# Patient Record
Sex: Male | Born: 1970 | Race: White | Hispanic: No | Marital: Married | Smoking: Current every day smoker
Health system: Southern US, Community
[De-identification: ages and names within clinical notes are randomized; demographics above are authoritative.]

## PROBLEM LIST (undated history)

## (undated) DIAGNOSIS — Z720 Tobacco use: Secondary | ICD-10-CM

## (undated) DIAGNOSIS — I10 Essential (primary) hypertension: Secondary | ICD-10-CM

## (undated) HISTORY — DX: Tobacco use: Z72.0

## (undated) HISTORY — DX: Essential (primary) hypertension: I10

---

## 2009-12-10 ENCOUNTER — Encounter: Admission: RE | Admit: 2009-12-10 | Discharge: 2009-12-10 | Payer: Self-pay | Admitting: Gastroenterology

## 2010-12-20 IMAGING — CT CT ABD-PELV W/ CM
2 of 4 series · 11 of 36 positions shown, 18 images · IV contrast (READICAT/WATER & [ID] OMNI 300)
Comparison: None.

CLINICAL DATA: Left lower quadrant pain.

CT ABDOMEN AND PELVIS WITH CONTRAST
TECHNIQUE: Multidetector CT imaging of the abdomen and pelvis was
performed following the standard protocol during bolus
administration of intravenous contrast.
Contrast: 100 ml [id number]

[Series 3: routine abdomen · axial · 0.66mm/px · z∈[-361,-1]mm · 10 of 89 slices shown, 16 images]
[im 9/89  soft-tissue]
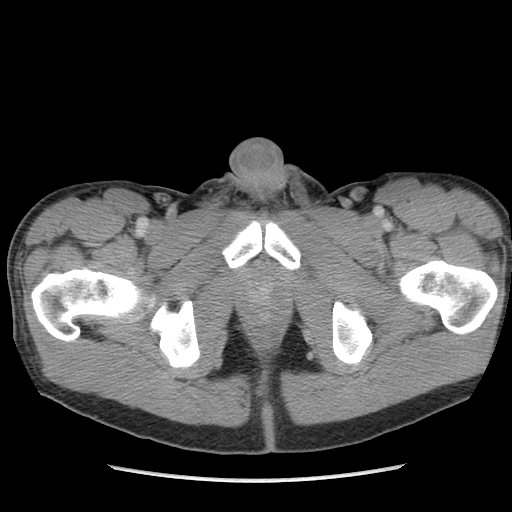
[im 9/89  bone]
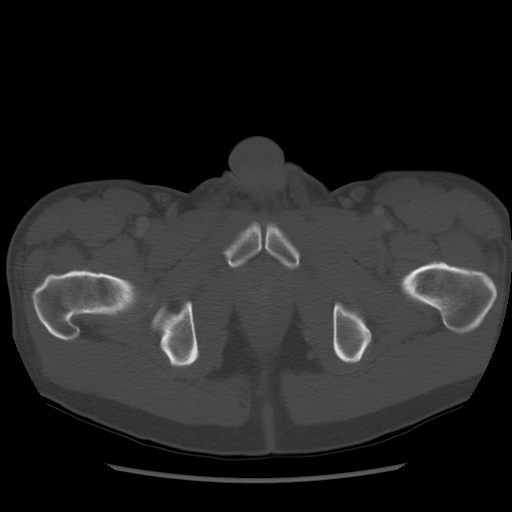
[im 17/89  soft-tissue]
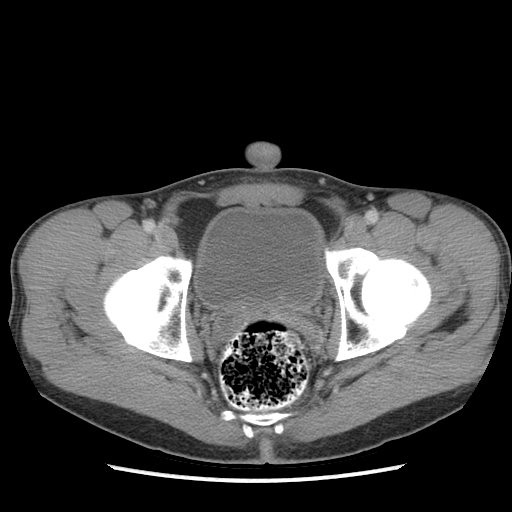
[im 25/89  soft-tissue]
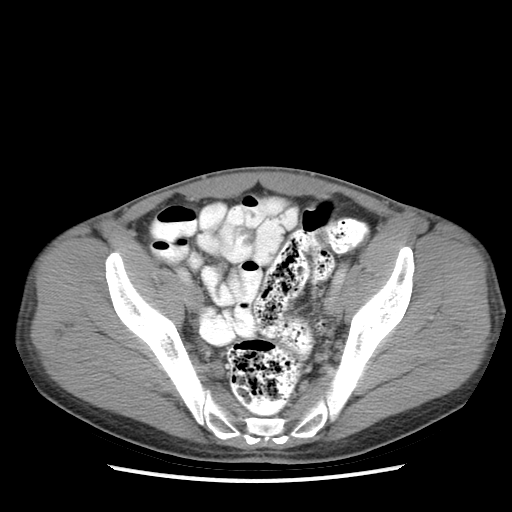
[im 33/89  soft-tissue]
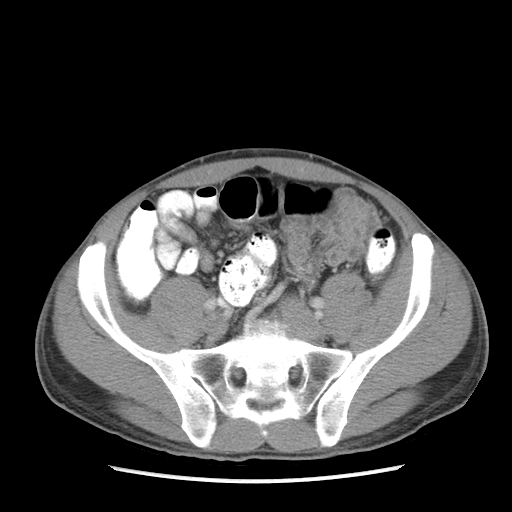
[im 41/89  soft-tissue]
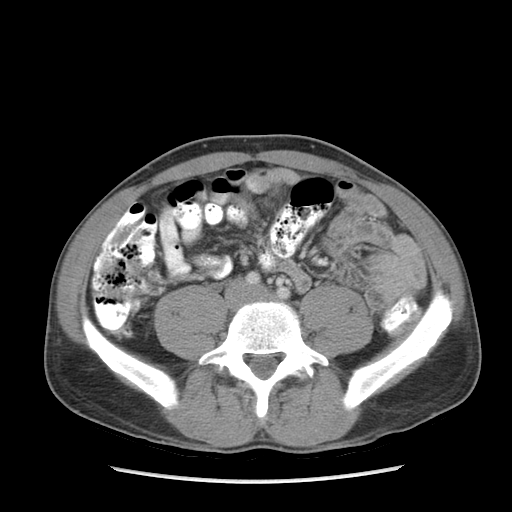
[im 49/89  soft-tissue]
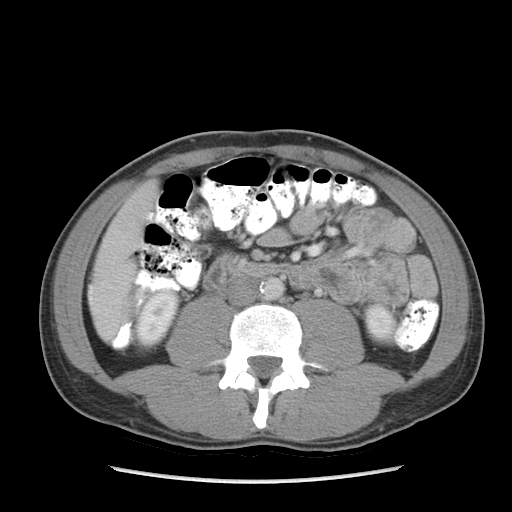
[im 57/89  soft-tissue]
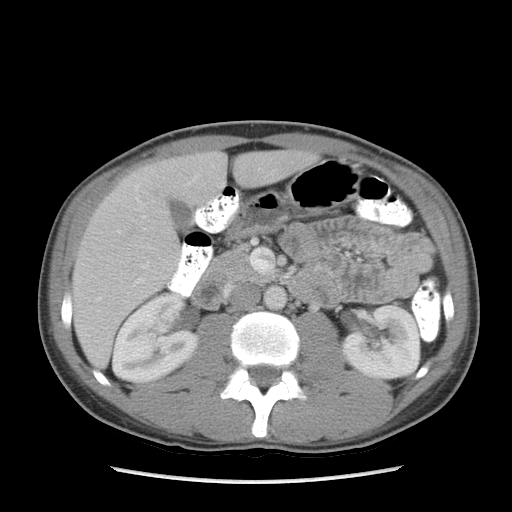
[im 57/89  lung]
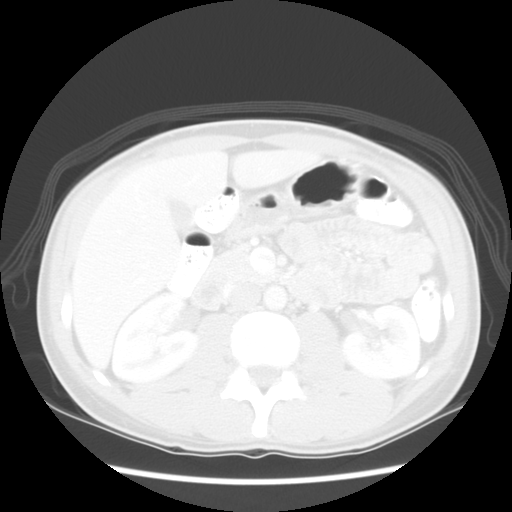
[im 65/89  soft-tissue]
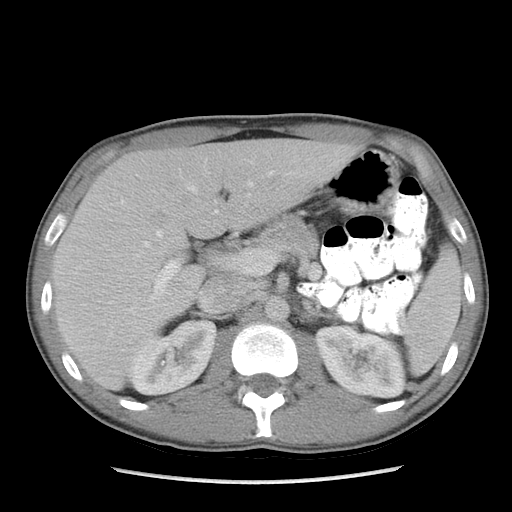
[im 65/89  lung]
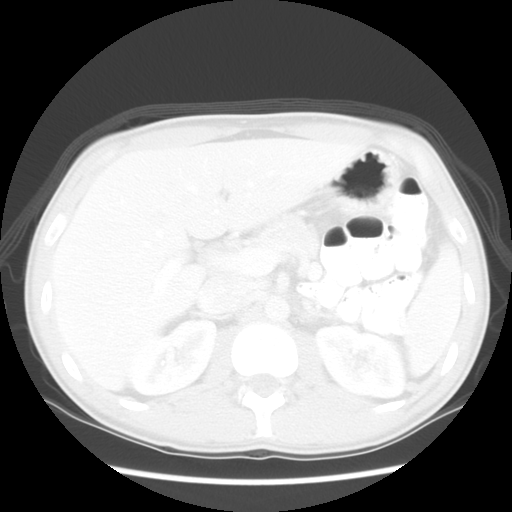
[im 73/89  soft-tissue]
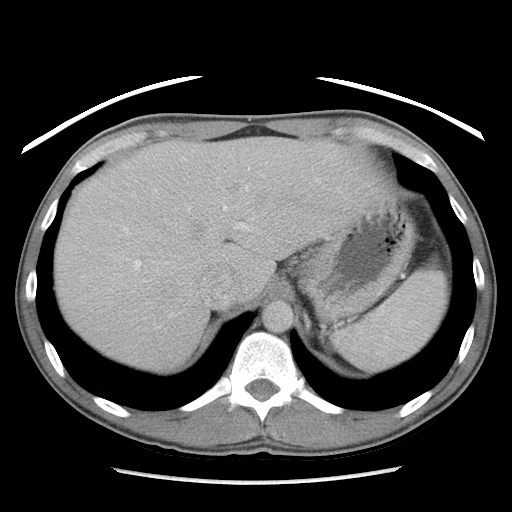
[im 73/89  lung]
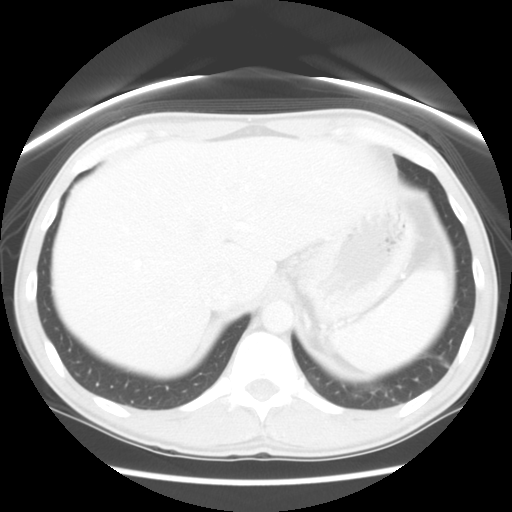
[im 73/89  bone]
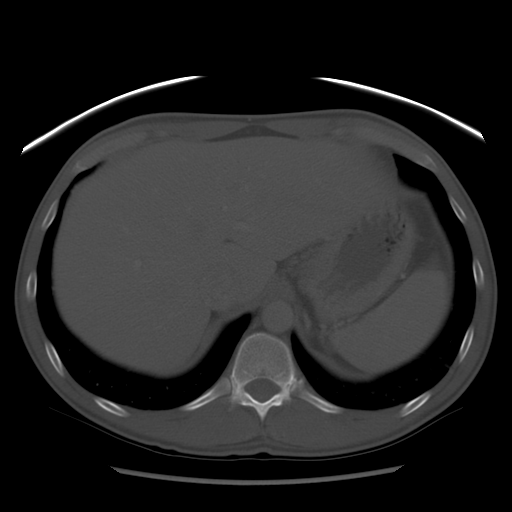
[im 81/89  soft-tissue]
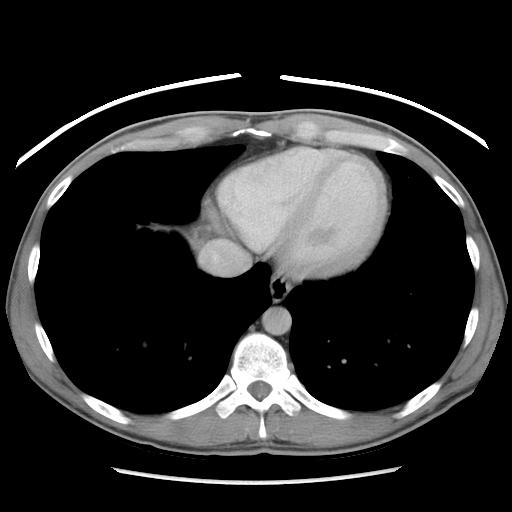
[im 81/89  lung]
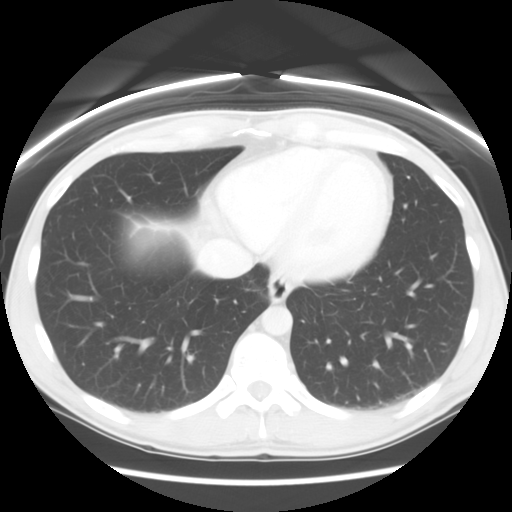

[Series 601: coronal body · coronal · 0.89mm/px · 1 of 98 slices shown, 2 images]
[im 33/98  soft-tissue]
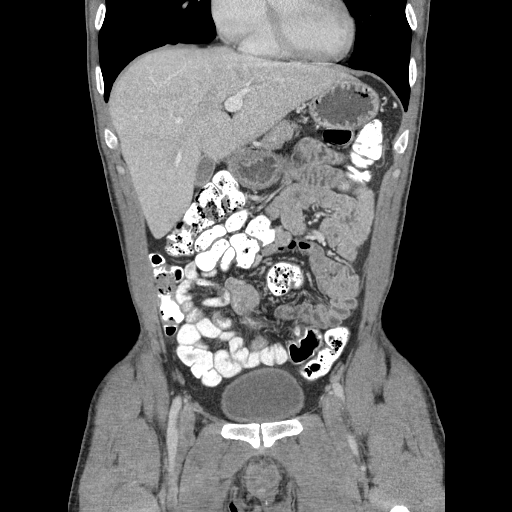
[im 33/98  bone]
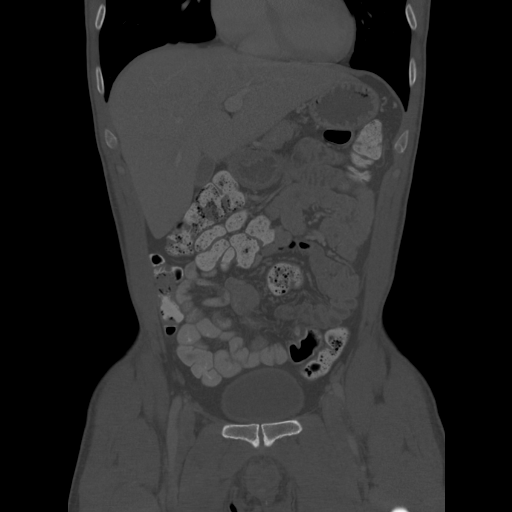

[11 of 36 positions shown; findings below may reference images not displayed]

FINDINGS: The liver and spleen are normal.  The stomach, duodenum,
pancreas, gallbladder, adrenal glands, and abdominal bowel loops
have normal imaging features.

A pair of tiny 1-2 mm stones are seen in the upper pole of the
right kidney without obstruction.  A single 1-2 mm nonobstructing
calculus is identified in the upper pole of the left kidney.

No abdominal lymphadenopathy.  No abdominal aortic aneurysm no
abdominal free fluid.

No free fluid in the pelvis.  No pelvic lymphadenopathy.  No
evidence for colonic diverticulitis.  The colon is largely free
from diverticular change.  The terminal ileum and the appendix are
normal.  There is a somewhat prominent volume of stool throughout
the entire length of the colon.

Bone windows reveal no worrisome lytic or sclerotic osseous
lesions.
IMPRESSION: Tiny nonobstructing bilateral renal calculi.  No CT findings to
explain the patient's history of left lower quadrant pain.

Stool volume in the colon is slightly prominent, an imaging finding
which would be compatible with a degree of clinical constipation.

## 2017-09-05 ENCOUNTER — Ambulatory Visit: Payer: Self-pay | Admitting: Cardiology

## 2021-09-21 ENCOUNTER — Encounter: Payer: Self-pay | Admitting: Allergy and Immunology

## 2021-09-21 ENCOUNTER — Ambulatory Visit (INDEPENDENT_AMBULATORY_CARE_PROVIDER_SITE_OTHER): Payer: BC Managed Care – PPO | Admitting: Allergy and Immunology

## 2021-09-21 ENCOUNTER — Other Ambulatory Visit: Payer: Self-pay

## 2021-09-21 VITALS — BP 138/84 | HR 103 | Resp 16 | Ht 70.0 in | Wt 172.0 lb

## 2021-09-21 DIAGNOSIS — F17210 Nicotine dependence, cigarettes, uncomplicated: Secondary | ICD-10-CM | POA: Diagnosis not present

## 2021-09-21 DIAGNOSIS — F1029 Alcohol dependence with unspecified alcohol-induced disorder: Secondary | ICD-10-CM

## 2021-09-21 DIAGNOSIS — J3089 Other allergic rhinitis: Secondary | ICD-10-CM

## 2021-09-21 DIAGNOSIS — J301 Allergic rhinitis due to pollen: Secondary | ICD-10-CM

## 2021-09-21 DIAGNOSIS — J4530 Mild persistent asthma, uncomplicated: Secondary | ICD-10-CM | POA: Diagnosis not present

## 2021-09-21 MED ORDER — RYALTRIS 665-25 MCG/ACT NA SUSP: 2.0000 | Freq: Two times a day (BID) | NASAL | 5 refills | Status: DC

## 2021-09-21 MED ORDER — FLOVENT HFA 110 MCG/ACT IN AERO: INHALATION_SPRAY | RESPIRATORY_TRACT | 5 refills | Status: DC

## 2021-09-21 NOTE — Progress Notes (Signed)
?[place] - [facility] - [place] - [place] - [place] ? ? ?Dear Suann Larry, ? ?Thank you for referring George Campos to the [facility] [place] on 09/21/2021.  ? ?Below is a summation of this patient's evaluation and recommendations. ? ?Thank you for your referral. I will keep you informed about this patient's response to treatment.  ? ?If you have any questions please do not hesitate to contact me.  ? ?Sincerely, ? ?Jessica Priest, MD ?Allergy / Immunology ?[facility] [place] ? ? ?______________________________________________________________________ ? ? ? ?NEW PATIENT NOTE ? ?Referring Provider: Tamsen Snider, NP ?Primary Provider: Tamsen Snider, NP ?Date of office visit: 09/21/2021 ?   ?Subjective:  ? ?Chief Complaint:  George Campos (DOB: April 12, 1971) is a 51 y.o. male who presents to the clinic on 09/21/2021 with a chief complaint of Cough and Nasal Congestion ?.    ? ?HPI: Keevin presents to this clinic in evaluation of several issues: ? ?First, for many years he has been having progressive issues with coughing and wheezing and dyspnea on exertion for which he has been given albuterol which he uses 3 times per day.  He cannot really find any obvious provoking trigger giving rise to this issue.  On occasion he feels as though his throat is coated with phlegm that he cannot clear out and he has lots of throat clearing and postnasal drip and sometimes he gets to the point where he is gagging and retching.  He does not have any classic reflux symptoms.  Apparently he required a Kenalog injection in February 2023 for this issue.  He is not really sure that Kenalog helps him very much. ? ?Second, he smokes tobacco at a rate of 1 pack/day for stress reduction.  He has been smoking since a teenager. ? ?Third, he has issues with nasal congestion and sneezing without any anosmia and also occasionally has some itchy eyes  especially was exposed to the outdoors.  He has tried nasal steroids in the past but he does not really think that they helped very much and he did not like the smell associated with the use of these agents.  He uses a antihistamine every day.  Recently he has been given montelukast which has not helped him at all. ? ?Fourth, he has snoring and sleepwalking but apparently there is no issue tied up with apnea while he sleeps and he does not have daytime sleepiness. ? ?Fifth, he drinks alcohol at a rate of 6-8 beers per day currently.  In the past he drank much more than that amount.  He has been slowly tapering down his alcohol consumption.  He drinks 2 caffeinated drinks per day. ? ?Past Medical History:  ?Diagnosis Date  ? High blood pressure   ? Tobacco abuse   ? ? ?History reviewed. No pertinent surgical history. ? ?Allergies as of 09/21/2021   ? ?   Reactions  ? Penicillins Other (See Comments)  ? Happened as a child  ? ?  ? ?  ?Medication List  ? ? ?albuterol 108 (90 Base) MCG/ACT inhaler ?Commonly known as: VENTOLIN HFA ?SMARTSIG:2 inhalation Via Inhaler Every 4-6 Hours PRN ?  ?amLODipine 10 MG tablet ?Commonly known as: NORVASC ?Take 10 mg by mouth daily. ?  ?B COMPLEX PO ?Take by mouth. ?  ?fexofenadine 180 MG tablet ?Commonly known as: ALLEGRA ?Take 180 mg by mouth daily. ?  ?montelukast 10 MG tablet ?Commonly  known as: SINGULAIR ?SMARTSIG:1 Tablet(s) By Mouth Every Evening ?  ?MULTIVITAMIN PO ?Take by mouth daily. ?  ? ?Review of systems negative except as noted in HPI / PMHx or noted below: ? ?Review of Systems  ?Constitutional: Negative.   ?HENT: Negative.    ?Eyes: Negative.   ?Respiratory: Negative.    ?Cardiovascular: Negative.   ?Gastrointestinal: Negative.   ?Genitourinary: Negative.   ?Musculoskeletal: Negative.   ?Skin: Negative.   ?Neurological: Negative.   ?Endo/Heme/Allergies: Negative.   ?Psychiatric/Behavioral: Negative.    ? ?Family History  ?Adopted: Yes  ?Family history unknown: Yes   ? ? ?Social History  ? ?Socioeconomic History  ? Marital status: Unknown  ?  Spouse name: Not on file  ? Number of children: Not on file  ? Years of education: Not on file  ? Highest education level: Not on file  ?Occupational History  ? Not on file  ?Tobacco Use  ? Smoking status: Every Day  ?  Packs/day: 1.00  ?  Types: Cigarettes  ? Smokeless tobacco: Never  ? Tobacco comments:  ?  Has been smoking about 30 to 35 years.  ?Substance and Sexual Activity  ? Alcohol use: Yes  ? Drug use: Never  ? Sexual activity: Not on file  ?Other Topics Concern  ? Not on file  ?Social History Narrative  ? Not on file  ? ?Environmental and Social history ? ?Lives in a house with a cat and dog, no carpet in the bedroom, plastic on the bed, no plastic on the pillow, actively smoking tobacco products, and employment as a Immunologist. ? ?Objective:  ? ?Vitals:  ? 09/21/21 0904 09/21/21 1017  ?BP: (!) 160/96 138/84  ?Pulse: (!) 103   ?Resp: 16   ?SpO2: 98%   ? ?Height: 5\' 10"  (177.8 cm) ?Weight: 172 lb (78 kg) ? ?Physical Exam ?Constitutional:   ?   Appearance: He is not diaphoretic.  ?HENT:  ?   Head: Normocephalic.  ?   Right Ear: Tympanic membrane, ear canal and external ear normal.  ?   Left Ear: Tympanic membrane, ear canal and external ear normal.  ?   Nose: Nose normal. No mucosal edema or rhinorrhea.  ?   Mouth/Throat:  ?   Pharynx: Uvula midline. No oropharyngeal exudate.  ?Eyes:  ?   Conjunctiva/sclera: Conjunctivae normal.  ?Neck:  ?   Thyroid: No thyromegaly.  ?   Trachea: Trachea normal. No tracheal tenderness or tracheal deviation.  ?Cardiovascular:  ?   Rate and Rhythm: Normal rate and regular rhythm.  ?   Heart sounds: Normal heart sounds, S1 normal and S2 normal. No murmur heard. ?Pulmonary:  ?   Effort: No respiratory distress.  ?   Breath sounds: Normal breath sounds. No stridor. No wheezing or rales.  ?Lymphadenopathy:  ?   Head:  ?   Right side of head: No tonsillar adenopathy.  ?   Left side of head: No tonsillar  adenopathy.  ?   Cervical: No cervical adenopathy.  ?Skin: ?   Findings: No erythema or rash.  ?   Nails: There is no clubbing.  ?Neurological:  ?   Mental Status: He is alert.  ? ? ?Diagnostics: Allergy skin tests were not performed. ? ?Review blood tests dated 03 September 2021 identifies IgE antibodies directed against house dust mite, cat, dog, grasses, trees, weeds. ? ?Spirometry was performed and demonstrated an FEV1 of 2.98 @ 75 % of predicted. FEV1/FVC = 0.81.  Following the administration of  nebulized albuterol his FEV1 increased to 3.25 which was a calculated increase of 9% ? ? ?Assessment and Plan:  ? ? ?1. Not well controlled mild persistent asthma   ?2. Heavy tobacco smoker >10 cigarettes per day   ?3. Perennial allergic rhinitis   ?4. Seasonal allergic rhinitis due to pollen   ?5. Alcohol dependence with unspecified alcohol-induced disorder (HCC)   ? ? ?1.  Allergen avoidance measures - dust mite, cat, dog, pollen  ? ?2.  Treat and prevent inflammation: ? ?A. Flovent 110 - 2 inhalations 2 times per day w/ spacer (empty lungs) ?B. Ryaltris - 2 sprays each nostril 1-2 times per day ? ?3. If needed: ? ?A. Albuterol HFA - 2 inhalations every 4-6 hours ?B. OTC antihistamine ?C. OTC Pataday - 1 drop each eye 1 time per day ? ?4.  Consider using nicotine substitutes to replace tobacco smoke exposure ? ?5.  Consolidate total milligrams of caffeine per day slowly ? ?6.  Consolidate total grams of alcohol per day slowly ? ?7.  Consider starting immunotherapy ? ?8.  Return to clinic in 4 weeks or earlier if problem. ? ?Barbara CowerJason appears to have atopic disease giving rise to inflammation of his airway and he will do his best to perform allergen avoidance measures and consistently use anti-inflammatory agents for his airway.  He also has an issue tied up with tobacco and caffeine and alcohol use that I addressed during today's visit.  It would obviously be best if his airway did not experience smoke exposure and he can  consolidate some of his caffeine to deal with what appears to be some baseline anxiety and he should obviously consolidate his alcohol consumption given the long-term consequences of his alcohol consumption.  For his ato

## 2021-09-21 NOTE — Patient Instructions (Addendum)
?  1.  Allergen avoidance measures - dust mite, cat, dog, pollen  ? ?2.  Treat and prevent inflammation: ? ?A. Flovent 110 - 2 inhalations 2 times per day w/ spacer (empty lungs) ?B. Ryaltris - 2 sprays each nostril 1-2 times per day ? ?3. If needed: ? ?A. Albuterol HFA - 2 inhalations every 4-6 hours ?B. OTC antihistamine ?C. OTC Pataday - 1 drop each eye 1 time per day ? ?4.  Consider using nicotine substitutes to replace tobacco smoke exposure ? ?5.  Consolidate total milligrams of caffeine per day slowly ? ?6.  Consolidate total grams of alcohol per day slowly ? ?7.  Consider starting immunotherapy ? ?8.  Return to clinic in 4 weeks or earlier if problem. ?

## 2021-09-22 ENCOUNTER — Encounter: Payer: Self-pay | Admitting: Allergy and Immunology

## 2021-10-22 ENCOUNTER — Encounter: Payer: Self-pay | Admitting: Allergy and Immunology

## 2021-10-22 ENCOUNTER — Ambulatory Visit: Payer: BC Managed Care – PPO | Admitting: Allergy and Immunology

## 2021-10-22 VITALS — BP 152/82 | HR 106 | Resp 14

## 2021-10-22 DIAGNOSIS — F1029 Alcohol dependence with unspecified alcohol-induced disorder: Secondary | ICD-10-CM | POA: Diagnosis not present

## 2021-10-22 DIAGNOSIS — F17210 Nicotine dependence, cigarettes, uncomplicated: Secondary | ICD-10-CM | POA: Diagnosis not present

## 2021-10-22 DIAGNOSIS — J301 Allergic rhinitis due to pollen: Secondary | ICD-10-CM

## 2021-10-22 DIAGNOSIS — J3089 Other allergic rhinitis: Secondary | ICD-10-CM

## 2021-10-22 DIAGNOSIS — J4540 Moderate persistent asthma, uncomplicated: Secondary | ICD-10-CM

## 2021-10-22 NOTE — Progress Notes (Signed)
? ?[place] - [facility] - [place] - [place] - [place] ? ? ?Follow-up Note ? ?Referring Provider: Gordan Payment., MD ?Primary Provider: Tamsen Snider, NP ?Date of Office Visit: 10/22/2021 ? ?Subjective:  ? ?George Campos (DOB: 1970-09-06) is a 51 y.o. male who [facility] on 10[facility]22/2021 in re-evaluation of the following: ? ?HPI: Rasheed returns to this clinic in evaluation of asthma, tobacco smoking, allergic rhinitis, alcohol dependence.  His last visit to this clinic was 21 September 2021. ? ?He is not really that much better regarding his lower respiratory tract symptoms.  He still coughs a whole bunch and in fact he is somewhat worse since this weekend.  His nose is still congested.  He still feels like his throat is coated with phlegm.  He still continues to smoke a pack a day of cigarettes.  He still continues to drink 6-8 beers per day. ? ?He had a chest CT scan performed this morning. ? ?Allergies as of 10/22/2021   ? ?   Reactions  ? Penicillins Other (See Comments)  ? Happened as a child  ? ?  ? ?  ?Medication List  ? ? ?albuterol 108 (90 Base) MCG/ACT inhaler ?Commonly known as: VENTOLIN HFA ?SMARTSIG:2 inhalation Via Inhaler Every 4-6 Hours PRN ?  ?amLODipine 10 MG tablet ?Commonly known as: NORVASC ?Take 10 mg by mouth daily. ?  ?B COMPLEX PO ?Take by mouth. ?  ?fexofenadine 180 MG tablet ?Commonly known as: ALLEGRA ?Take 180 mg by mouth daily. ?  ?Flovent HFA 110 MCG/ACT inhaler ?Generic drug: fluticasone ?Inhale two puffs using spacer twice daily to prevent cough or wheeze.  Rinse, gargle, and spit after use. ?  ?montelukast 10 MG tablet ?Commonly known as: SINGULAIR ?SMARTSIG:1 Tablet(s) By Mouth Every Evening ?  ?MULTIVITAMIN PO ?Take by mouth daily. ?  ?rosuvastatin 5 MG tablet ?Commonly known as: CRESTOR ?Take 5 mg by mouth daily. ?  ?Ryaltris 665-25 MCG/ACT Susp ?Generic drug: Olopatadine-Mometasone ?Place 2 sprays into both nostrils 2 (two) times daily. ?   ?XYZAL PO ?Take by mouth daily as needed. ?  ? ?Past Medical History:  ?Diagnosis Date  ? High blood pressure   ? Tobacco abuse   ? ? ?History reviewed. No pertinent surgical history. ? ?Review of systems negative except as noted in HPI / PMHx or noted below: ? ?Review of Systems  ?Constitutional: Negative.   ?HENT: Negative.    ?Eyes: Negative.   ?Respiratory: Negative.    ?Cardiovascular: Negative.   ?Gastrointestinal: Negative.   ?Genitourinary: Negative.   ?Musculoskeletal: Negative.   ?Skin: Negative.   ?Neurological: Negative.   ?Endo/Heme/Allergies: Negative.   ?Psychiatric/Behavioral: Negative.    ? ? ?Objective:  ? ?Vitals:  ? 10/22/21 1102  ?BP: (!) 152/82  ?Pulse: (!) 106  ?Resp: 14  ?SpO2: 93%  ? ?   ?   ? ?Physical Exam ?Constitutional:   ?   Appearance: He is not diaphoretic.  ?   Comments: Coughing  ?HENT:  ?   Head: Normocephalic.  ?   Right Ear: Tympanic membrane, ear canal and external ear normal.  ?   Left Ear: Tympanic membrane, ear canal and external ear normal.  ?   Nose: Nose normal. No mucosal edema or rhinorrhea.  ?   Mouth/Throat:  ?   Pharynx: Uvula midline. No oropharyngeal exudate.  ?Eyes:  ?   Conjunctiva/sclera: Conjunctivae normal.  ?Neck:  ?   Thyroid: No thyromegaly.  ?  Trachea: Trachea normal. No tracheal tenderness or tracheal deviation.  ?Cardiovascular:  ?   Rate and Rhythm: Normal rate and regular rhythm.  ?   Heart sounds: Normal heart sounds, S1 normal and S2 normal. No murmur heard. ?Pulmonary:  ?   Effort: No respiratory distress.  ?   Breath sounds: Normal breath sounds. No stridor. No wheezing or rales.  ?Lymphadenopathy:  ?   Head:  ?   Right side of head: No tonsillar adenopathy.  ?   Left side of head: No tonsillar adenopathy.  ?   Cervical: No cervical adenopathy.  ?Skin: ?   Findings: No erythema or rash.  ?   Nails: There is no clubbing.  ?Neurological:  ?   Mental Status: He is alert.  ? ? ?Diagnostics:  ?  ?Spirometry was not performed secondary to  coughing ? ?Assessment and Plan:  ? ?1. Not well controlled moderate persistent asthma   ?2. Perennial allergic rhinitis   ?3. Seasonal allergic rhinitis due to pollen   ?4. Heavy tobacco smoker >10 cigarettes per day   ?5. Alcohol dependence with unspecified alcohol-induced disorder (HCC)   ? ? ?1.  Allergen avoidance measures - dust mite, cat, dog, pollen  ? ?2.  Treat and prevent inflammation: ? ?A. Breztri - 2 inhalations 2 times per day w/ spacer (empty lungs) ?B. Ryaltris - 2 sprays each nostril 1-2 times per day ?C. Prednisone 10 mg - 1 tablet 1 time per day for 10 days only ? ?3. If needed: ? ?A. Albuterol HFA - 2 inhalations every 4-6 hours ?B. OTC antihistamine ?C. OTC Pataday - 1 drop each eye 1 time per day ? ?4.  Consider using nicotine substitutes to replace tobacco smoke exposure ? ?5.  Consolidate total milligrams of caffeine per day slowly ? ?6.  Consolidate total grams of alcohol per day slowly ? ?7.  Consider starting immunotherapy ? ?8.  Review Chest CT scan. ENT evaluation??? Yes, if CT OK ? ?9.  Return to clinic in 8 weeks or earlier if problem. ? ?If [place] CT scan is normal and he continues to cough and have some irritated throat we will need to obtain an ENT evaluation to examine both his upper airway and his throat.  I have increased his anti-inflammatory therapy for his respiratory tract inflammation with the use of a triple inhaler and a very short course of systemic steroids during today's visit.  He has atopic and there is no doubt that there is an atopic trigger giving rise to some of his issues and he would benefit from starting a course of immunotherapy.  He has a few other issues that need to be managed as noted above.  I will see him back in this clinic in 8 weeks or earlier if there is a problem. ? ?Laurette Schimke, MD ?Allergy / Immunology ?[facility] ?

## 2021-10-22 NOTE — Patient Instructions (Addendum)
?  1.  Allergen avoidance measures - dust mite, cat, dog, pollen  ? ?2.  Treat and prevent inflammation: ? ?A. Breztri - 2 inhalations 2 times per day w/ spacer (empty lungs) ?B. Ryaltris - 2 sprays each nostril 1-2 times per day ?C. Prednisone 10 mg - 1 tablet 1 time per day for 10 days only ? ?3. If needed: ? ?A. Albuterol HFA - 2 inhalations every 4-6 hours ?B. OTC antihistamine ?C. OTC Pataday - 1 drop each eye 1 time per day ? ?4.  Consider using nicotine substitutes to replace tobacco smoke exposure ? ?5.  Consolidate total milligrams of caffeine per day slowly ? ?6.  Consolidate total grams of alcohol per day slowly ? ?7.  Consider starting immunotherapy ? ?8.  Review Chest CT scan. ENT evaluation??? Yes, if CT OK ? ?9.  Return to clinic in 8 weeks or earlier if problem. ?

## 2021-10-26 ENCOUNTER — Encounter: Payer: Self-pay | Admitting: Allergy and Immunology

## 2021-10-26 MED ORDER — BREZTRI AEROSPHERE 160-9-4.8 MCG/ACT IN AERO: INHALATION_SPRAY | RESPIRATORY_TRACT | 5 refills | Status: DC

## 2021-11-12 ENCOUNTER — Ambulatory Visit: Payer: Self-pay | Admitting: Allergy and Immunology

## 2021-12-17 ENCOUNTER — Encounter: Payer: Self-pay | Admitting: Allergy and Immunology

## 2021-12-17 ENCOUNTER — Ambulatory Visit: Payer: BC Managed Care – PPO | Admitting: Allergy and Immunology

## 2021-12-17 VITALS — BP 150/82 | HR 106 | Resp 16

## 2021-12-17 DIAGNOSIS — J3089 Other allergic rhinitis: Secondary | ICD-10-CM

## 2021-12-17 DIAGNOSIS — F1029 Alcohol dependence with unspecified alcohol-induced disorder: Secondary | ICD-10-CM

## 2021-12-17 DIAGNOSIS — K219 Gastro-esophageal reflux disease without esophagitis: Secondary | ICD-10-CM

## 2021-12-17 DIAGNOSIS — J301 Allergic rhinitis due to pollen: Secondary | ICD-10-CM

## 2021-12-17 DIAGNOSIS — F17210 Nicotine dependence, cigarettes, uncomplicated: Secondary | ICD-10-CM | POA: Diagnosis not present

## 2021-12-17 DIAGNOSIS — J4540 Moderate persistent asthma, uncomplicated: Secondary | ICD-10-CM | POA: Diagnosis not present

## 2021-12-17 MED ORDER — BREZTRI AEROSPHERE 160-9-4.8 MCG/ACT IN AERO: INHALATION_SPRAY | RESPIRATORY_TRACT | 5 refills | Status: DC

## 2021-12-17 MED ORDER — PANTOPRAZOLE SODIUM 40 MG PO TBEC
DELAYED_RELEASE_TABLET | ORAL | 5 refills | Status: AC
Start: 2021-12-17 — End: ?

## 2021-12-17 NOTE — Progress Notes (Unsigned)
[place] - [place] - [place] - [place] - [place]   Follow-up Note  Referring Provider: Tamsen Snider, NP Primary Provider: Tamsen Snider, NP Date of Office Visit: 12/17/2021  Subjective:   George Campos (DOB: [birth year]) is a 51 y.o. male who [facility] on 12/17/2021 in re-evaluation of the following:  HPI: [name] returns to this clinic in evaluation of chronic cough, asthma, allergic rhinitis, tobacco smoking, and alcohol dependence.  His last visit to this clinic was 22 October 2021.  He still continues to cough especially upon awakening in the morning.  He has coughing associate with gagging and retching and vomiting.  During the day gets a little bit better.  Overall he has not really noticed much improvement with the therapy we prescribed during his last visit.  He still continues to consume caffeine, smoke tobacco, and drink alcohol as previously described.  Allergies as of 12/17/2021       Reactions   Penicillins Other (See Comments)   Happened as a child        Medication List    albuterol 108 (90 Base) MCG/ACT inhaler Commonly known as: VENTOLIN HFA SMARTSIG:2 inhalation Via Inhaler Every 4-6 Hours PRN   amLODipine-olmesartan 5-20 MG tablet Commonly known as: AZOR Take 1 tablet by mouth daily.   B COMPLEX PO Take by mouth.   Breztri Aerosphere 160-9-4.8 MCG/ACT Aero Generic drug: Budeson-Glycopyrrol-Formoterol Inhale two puffs with spacer twice daily to prevent cough or wheeze.  Rinse, gargle, and spit after use.   fluticasone 50 MCG/ACT nasal spray Commonly known as: FLONASE SMARTSIG:1 Spray(s) Both Nares Twice Daily PRN   levocetirizine 5 MG tablet Commonly known as: XYZAL Take 5 mg by mouth daily.   MULTIVITAMIN PO Take by mouth daily.   pantoprazole 40 MG tablet Commonly known as: PROTONIX 1 tablet 2 times per day Started by: Zacchaeus Halm Claudia Pollock, MD   rosuvastatin 5 MG tablet Commonly known as:  CRESTOR Take 5 mg by mouth daily.    Past Medical History:  Diagnosis Date   High blood pressure    Tobacco abuse     History reviewed. No pertinent surgical history.  Review of systems negative except as noted in HPI / PMHx or noted below:  Review of Systems  Constitutional: Negative.   HENT: Negative.    Eyes: Negative.   Respiratory: Negative.    Cardiovascular: Negative.   Gastrointestinal: Negative.   Genitourinary: Negative.   Musculoskeletal: Negative.   Skin: Negative.   Neurological: Negative.   Endo/Heme/Allergies: Negative.   Psychiatric/Behavioral: Negative.       Objective:   Vitals:   12/17/21 1456  BP: (!) 150/82  Pulse: (!) 106  Resp: 16  SpO2: 98%          Physical Exam Constitutional:      Appearance: He is not diaphoretic.  HENT:     Head: Normocephalic.     Right Ear: Tympanic membrane, ear canal and external ear normal.     Left Ear: Tympanic membrane, ear canal and external ear normal.     Nose: Nose normal. No mucosal edema or rhinorrhea.     Mouth/Throat:     Pharynx: Uvula midline. No oropharyngeal exudate.  Eyes:     Conjunctiva/sclera: Conjunctivae normal.  Neck:     Thyroid: No thyromegaly.     Trachea: Trachea normal. No tracheal tenderness or tracheal deviation.  Cardiovascular:     Rate and Rhythm: Normal rate and regular  rhythm.     Heart sounds: Normal heart sounds, S1 normal and S2 normal. No murmur heard. Pulmonary:     Effort: No respiratory distress.     Breath sounds: Normal breath sounds. No stridor. No wheezing or rales.  Lymphadenopathy:     Head:     Right side of head: No tonsillar adenopathy.     Left side of head: No tonsillar adenopathy.     Cervical: No cervical adenopathy.  Skin:    Findings: No erythema or rash.     Nails: There is no clubbing.  Neurological:     Mental Status: He is alert.     Diagnostics:   Review of a chest CT scan obtained 22 October 2021 identifies normal cardiovascular,  mediastinal, lung structures without any pulmonary infiltrates, pleural effusion, pneumothorax, or mass.  Assessment and Plan:   1. Not well controlled moderate persistent asthma   2. Perennial allergic rhinitis   3. Seasonal allergic rhinitis due to pollen   4. LPRD (laryngopharyngeal reflux disease)   5. Heavy tobacco smoker >10 cigarettes per day   6. Alcohol dependence with unspecified alcohol-induced disorder (HCC)    1.  Allergen avoidance measures - dust mite, cat, dog, pollen   2.  Treat and prevent inflammation:  A. Breztri - 2 inhalations 2 times per day w/ spacer (empty lungs) B. Flonase - 1 spray each nostril 2 times per day  3. Treat and prevent reflux / LPR:  A. Pantoprazole 40 mg - 1 tablet 2 times per day  4. If needed:  A. Albuterol HFA - 2 inhalations every 4-6 hours B. OTC antihistamine C. OTC Pataday - 1 drop each eye 1 time per day  5.  Consider using nicotine substitutes to replace tobacco smoke exposure  6.  Consolidate total milligrams of caffeine per day slowly  7.  Consolidate total grams of alcohol per day slowly  8.  Consider starting immunotherapy  9.  Evaluation of throat with ENT  10. Return to clinic in 8 weeks or earlier if problem.  Rollins's airway is still inflamed and irritated.  We are attempting to address the atopic trigger in his tobacco smoke exposure trigger with the plan noted above and now we will introduce therapy directed against LPR.  In addition, we will have his throat evaluated by ENT.  He would definitely be a candidate for immunotherapy and we have given him literature on this form of treatment in the past.  I will see him back in this clinic in 8 weeks or earlier if there is a problem.  Laurette Schimke, MD Allergy / Immunology [facility]

## 2021-12-17 NOTE — Patient Instructions (Signed)
  1.  Allergen avoidance measures - dust mite, cat, dog, pollen   2.  Treat and prevent inflammation:  A. Breztri - 2 inhalations 2 times per day w/ spacer (empty lungs) B. Flonase - 1 spray each nostril 2 times per day  3. Treat and prevent reflux / LPR:  A. Pantoprazole 40 mg - 1 tablet 2 times per day  4. If needed:  A. Albuterol HFA - 2 inhalations every 4-6 hours B. OTC antihistamine C. OTC Pataday - 1 drop each eye 1 time per day  5.  Consider using nicotine substitutes to replace tobacco smoke exposure  6.  Consolidate total milligrams of caffeine per day slowly  7.  Consolidate total grams of alcohol per day slowly  8.  Consider starting immunotherapy  9.  Evaluation of throat with ENT  10. Return to clinic in 8 weeks or earlier if problem.

## 2021-12-21 ENCOUNTER — Encounter: Payer: Self-pay | Admitting: Allergy and Immunology

## 2022-01-12 ENCOUNTER — Encounter: Payer: Self-pay | Admitting: Allergy and Immunology

## 2022-02-09 ENCOUNTER — Other Ambulatory Visit: Payer: Self-pay | Admitting: *Deleted

## 2022-02-09 MED ORDER — BREZTRI AEROSPHERE 160-9-4.8 MCG/ACT IN AERO: INHALATION_SPRAY | RESPIRATORY_TRACT | 3 refills | Status: AC

## 2022-02-11 ENCOUNTER — Ambulatory Visit: Payer: BC Managed Care – PPO | Admitting: Allergy and Immunology
